# Patient Record
Sex: Female | Born: 2000 | Race: White | Hispanic: No | Marital: Single
Health system: Southern US, Community
[De-identification: ages and names within clinical notes are randomized; demographics above are authoritative.]

---
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
## 2019-06-07 IMAGING — CR DG CHEST 2V
1 series · 2 of 2 positions shown · non-contrast
Comparison: None.

CLINICAL DATA: Bronchitis cough congestion and weakness

EXAM:
CHEST  2 VIEW

[Series 1: dg chest 2 view · 0.14mm/px · 2 of 2 slices shown]
[im 1/2]
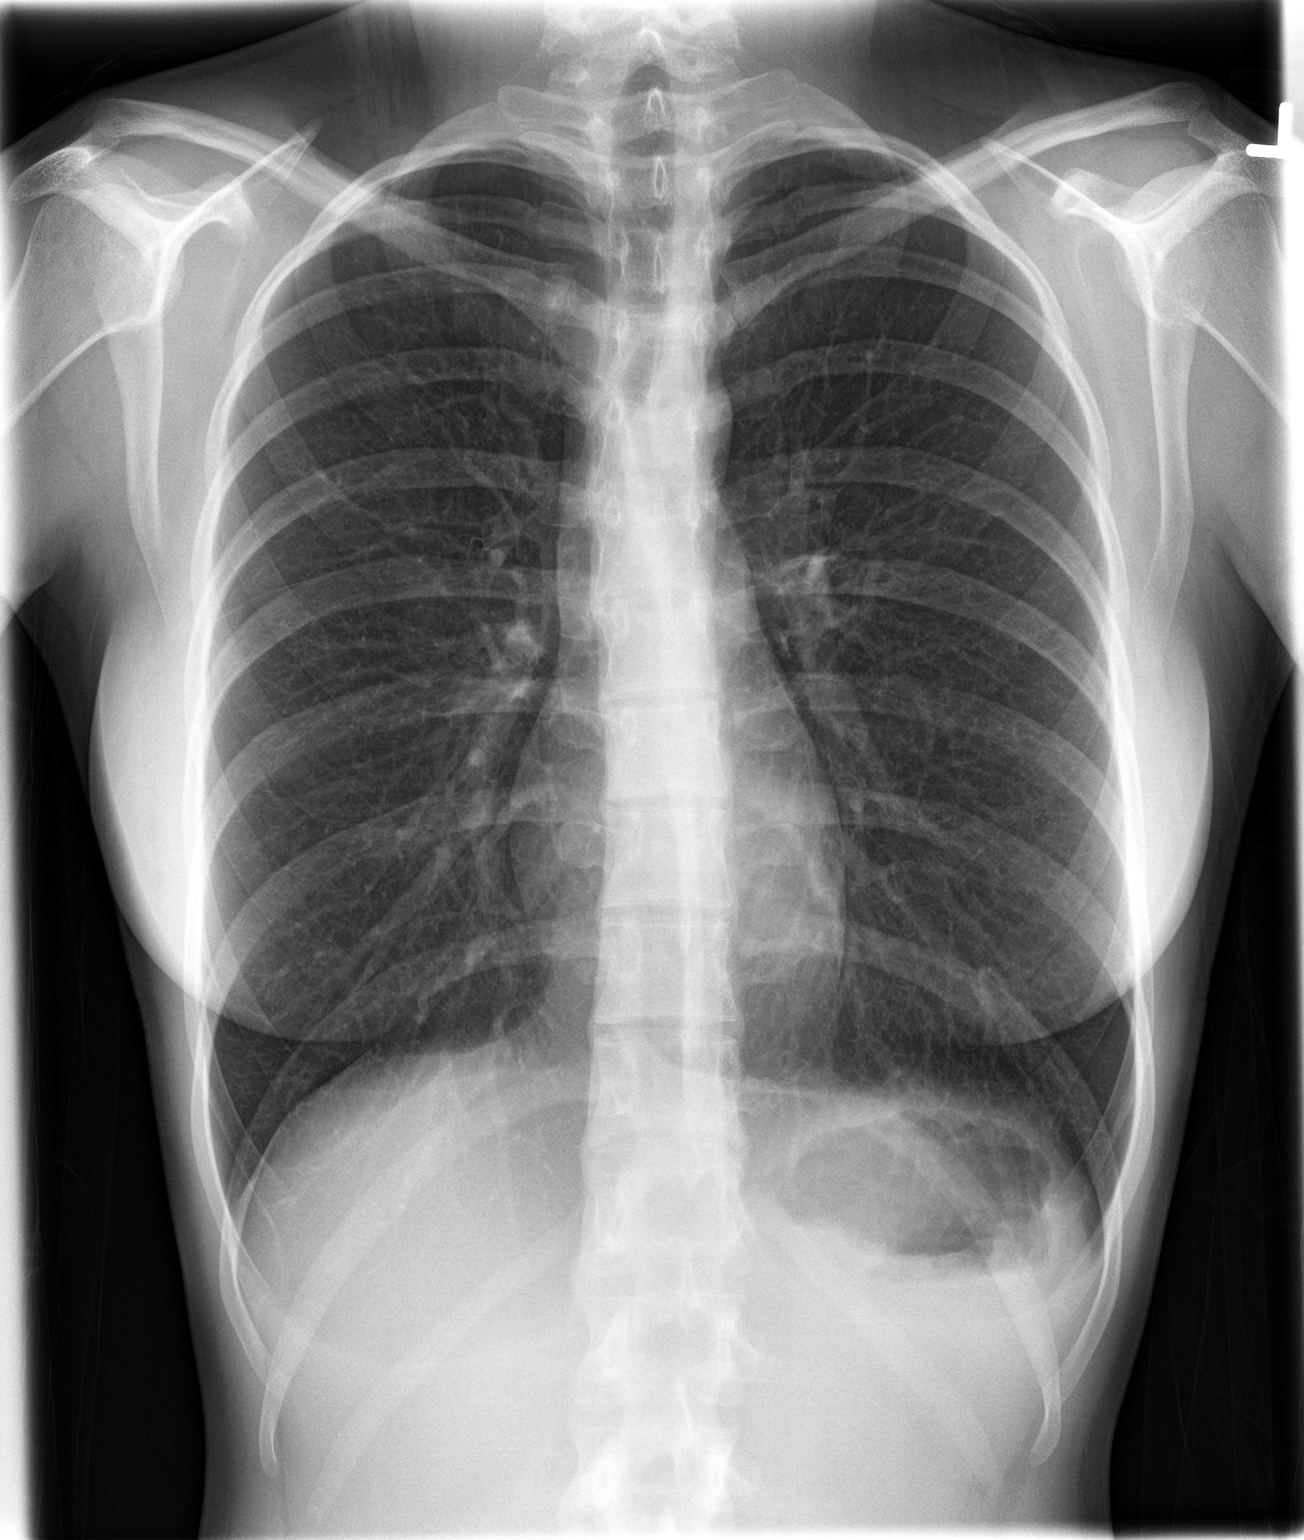
[im 2/2]
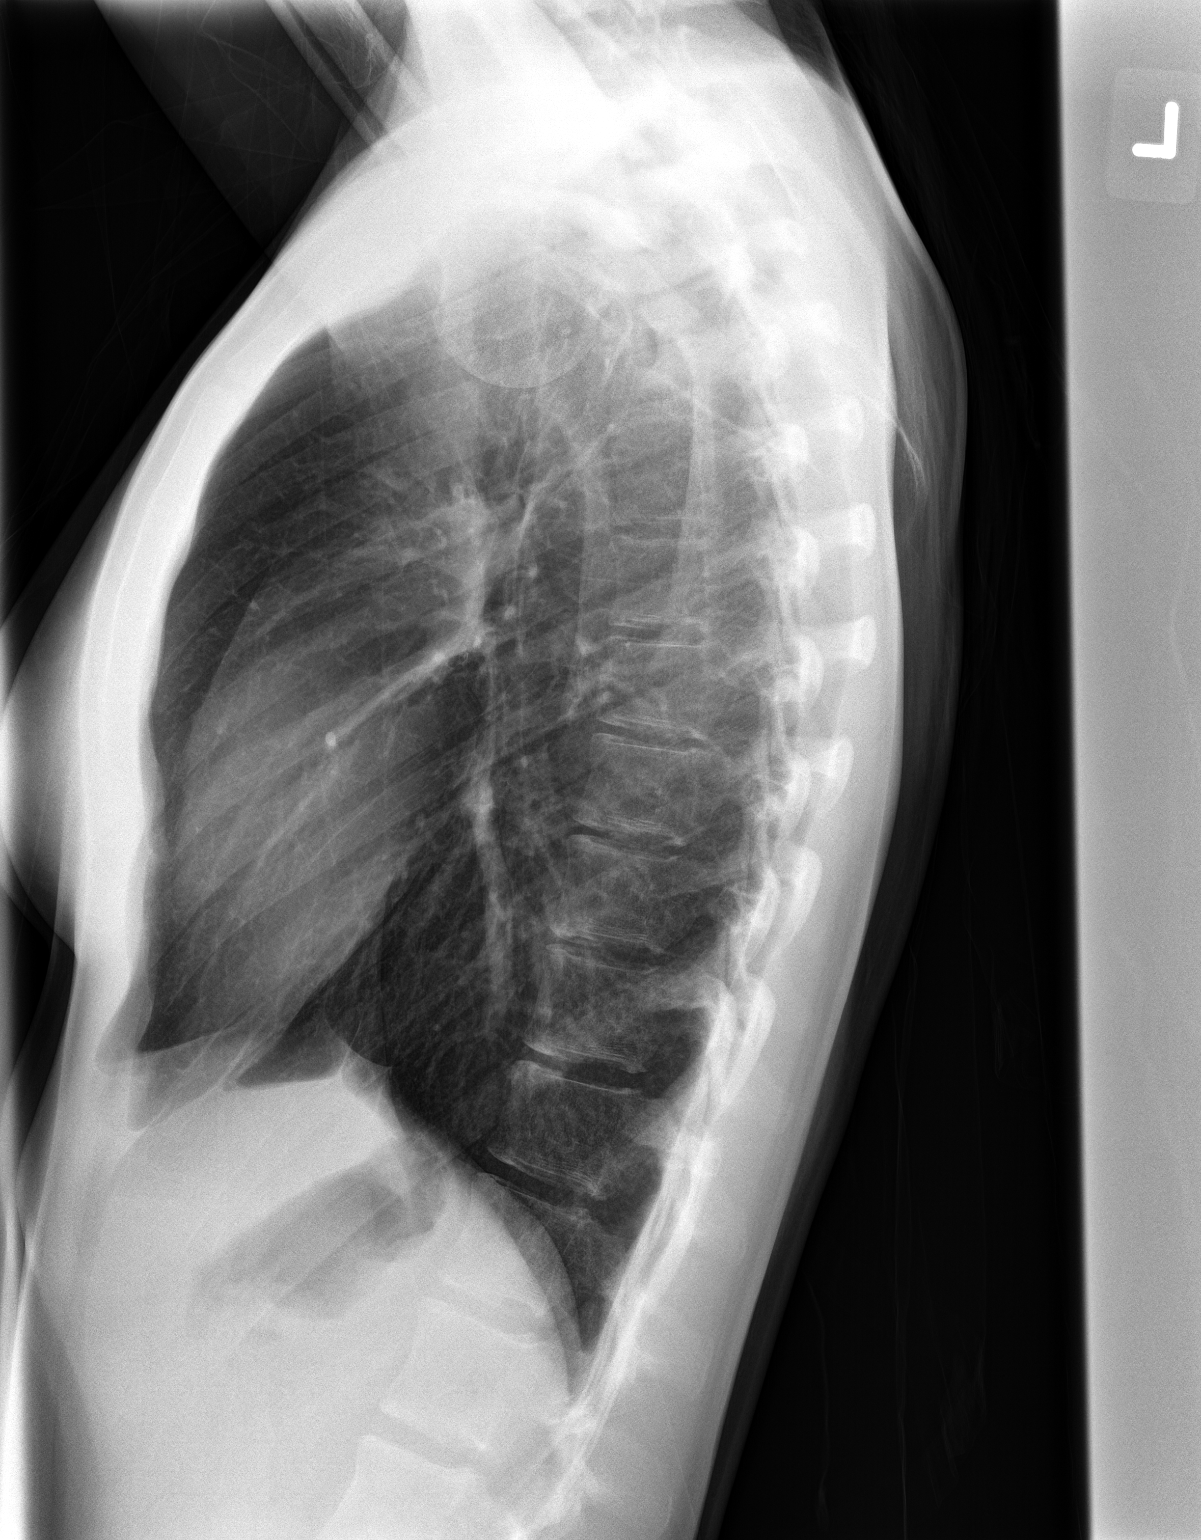

[2 of 2 positions shown; findings below may reference images not displayed]

FINDINGS: The heart size and mediastinal contours are within normal limits.
Both lungs are clear. The visualized skeletal structures are
unremarkable.
IMPRESSION: No active cardiopulmonary disease.

## 2019-09-11 ENCOUNTER — Other Ambulatory Visit: Payer: Self-pay

## 2019-09-11 DIAGNOSIS — Z20822 Contact with and (suspected) exposure to covid-19: Secondary | ICD-10-CM

## 2019-09-14 LAB — NOVEL CORONAVIRUS, NAA: SARS-CoV-2, NAA: NOT DETECTED

## 2020-01-09 ENCOUNTER — Ambulatory Visit: Payer: Self-pay | Attending: Internal Medicine

## 2020-01-09 DIAGNOSIS — Z23 Encounter for immunization: Secondary | ICD-10-CM

## 2020-01-09 NOTE — Progress Notes (Signed)
   Cov[facility] Vaccination [facility]  Name:  Linda Brennan    MRN: [id number] DOB: 02/25/2001  01/09/2020  [name] was observed post Covid-19 immunization for 15 minutes without incident. She was provided with Vaccine Information Sheet and instruction to access the V-Safe system.   [name] was instructed to call 911 with any severe reactions post vaccine: Marland Kitchen Difficulty breathing  . Swelling of face and throat  . A fast heartbeat  . A bad rash all over body  . Dizziness and weakness   Immunizations Administered    Name Date Dose VIS Date Route   Pfizer COVID-19 Vaccine 01/09/2020  6:26 PM 0.3 mL 10/02/2019 Intramuscular   Manufacturer: [employer], [employer]   Lot: [id number]   NDC: [address]-0

## 2020-01-30 ENCOUNTER — Ambulatory Visit: Payer: Self-pay | Attending: Internal Medicine

## 2020-01-30 DIAGNOSIS — Z23 Encounter for immunization: Secondary | ICD-10-CM

## 2020-01-30 NOTE — Progress Notes (Signed)
   Covid-19 Vaccination Clinic  Name:  Linda Brennan    MRN: [id number] DOB: January 16, 2001  01/30/2020  [name] was observed post Covid-19 immunization for 15 minutes without incident. She was provided with Vaccine Information Sheet and instruction to access the V-Safe system.   [name] was instructed to call 911 with any severe reactions post vaccine: Marland Kitchen Difficulty breathing  . Swelling of face and throat  . A fast heartbeat  . A bad rash all over body  . Dizziness and weakness   Immunizations Administered    Name Date Dose VIS Date Route   Pfizer COVID-19 Vaccine 01/30/2020  5:48 PM 0.3 mL 10/02/2019 Intramuscular   Manufacturer: [employer], [employer]   Lot: [id number]   NDC: [address]-3
